# Patient Record
Sex: Male | Born: 1945 | Race: Black or African American | Hispanic: No | Marital: Single | Smoking: Never smoker
Health system: Southern US, Academic
[De-identification: ages and names within clinical notes are randomized; demographics above are authoritative.]

## PROBLEM LIST (undated history)

## (undated) DIAGNOSIS — R040 Epistaxis: Secondary | ICD-10-CM

## (undated) DIAGNOSIS — N400 Enlarged prostate without lower urinary tract symptoms: Secondary | ICD-10-CM

## (undated) DIAGNOSIS — I10 Essential (primary) hypertension: Secondary | ICD-10-CM

## (undated) HISTORY — PX: HX HERNIA REPAIR: SHX51

---

## 2006-01-19 ENCOUNTER — Other Ambulatory Visit (HOSPITAL_COMMUNITY): Payer: Self-pay

## 2021-10-28 ENCOUNTER — Encounter (HOSPITAL_COMMUNITY): Admission: RE | Payer: Self-pay | Source: Ambulatory Visit

## 2021-10-28 ENCOUNTER — Inpatient Hospital Stay (HOSPITAL_COMMUNITY): Admission: RE | Admit: 2021-10-28 | Payer: Medicare Other | Source: Ambulatory Visit | Admitting: Surgery

## 2021-10-28 SURGERY — COLONOSCOPY
Anesthesia: General

## 2021-11-02 IMAGING — MR MRI BRAIN WITHOUT AND WITH CONTRAST
10 of 13 series · 35 of 48 positions shown · IV contrast (gadavist)
Comparison: None available.

﻿EXAM:  MRI BRAIN WITHOUT AND WITH CONTRAST
INDICATION: Headaches, left-sided weakness and facial numbness.
TECHNIQUE: Multiplanar multisequential MRI of the brain was performed without and with 3 mL of Gadavist.

[Series 10: DWI · axial · 5.0mm · 1.35mm/px · z∈[-48,+78]mm · 9 of 88 slices shown (1 of 3)]
[im 1/88]
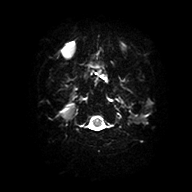
[im 16/88]
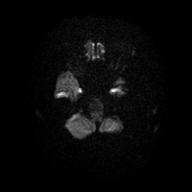
[im 24/88]
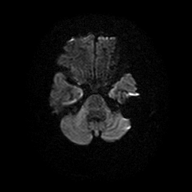
[im 40/88]
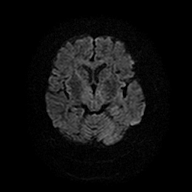
[im 48/88]
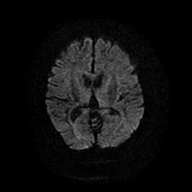
[im 64/88]
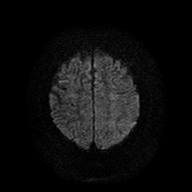
[im 72/88]
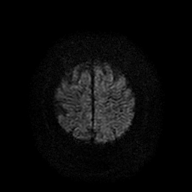
[im 80/88]
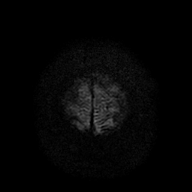
[im 88/88]
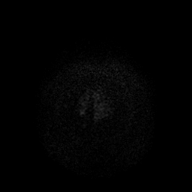

[Series 11: DWI · axial · 5.0mm · 1.35mm/px · z∈[-48,+78]mm · 3 of 22 slices shown (2 of 3)]
[im 1/22]
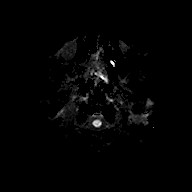
[im 11/22]
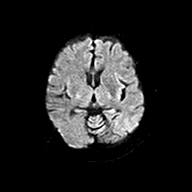
[im 22/22]
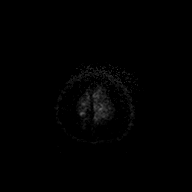

[Series 12: DWI · axial · 5.0mm · 1.35mm/px · z∈[-48,+78]mm · 3 of 22 slices shown (3 of 3)]
[im 1/22]
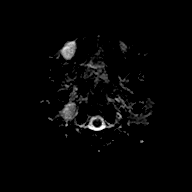
[im 11/22]
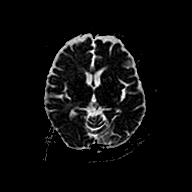
[im 22/22]
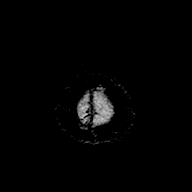

[Series 13: FLAIR · sagittal · 4.0mm · 0.75mm/px · 3 of 26 slices shown (1 of 2)]
[im 1/26]
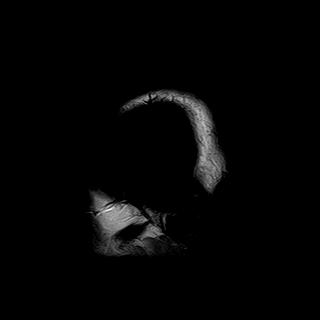
[im 13/26]
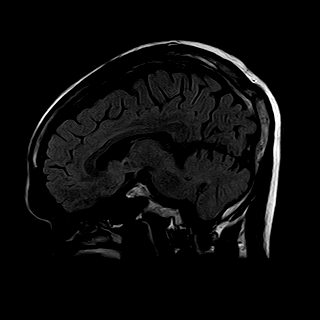
[im 26/26]
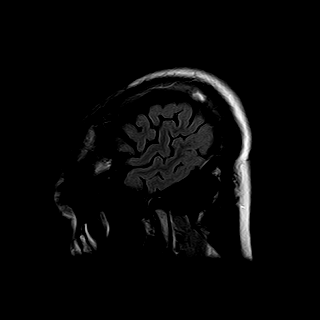

[Series 14: T2 · axial · 5.0mm · 0.43mm/px · z∈[-92,+46]mm · 3 of 25 slices shown (1 of 2)]
[im 1/25]
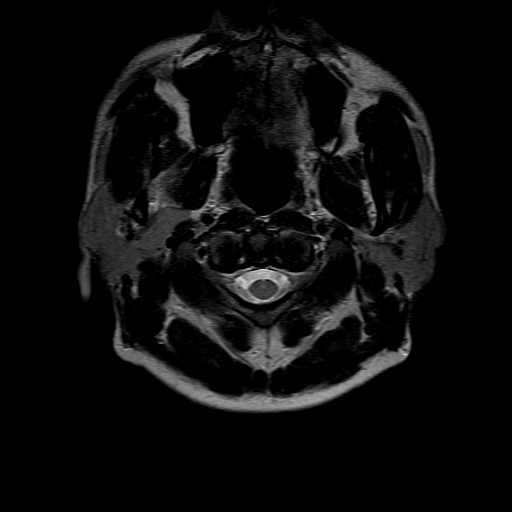
[im 13/25]
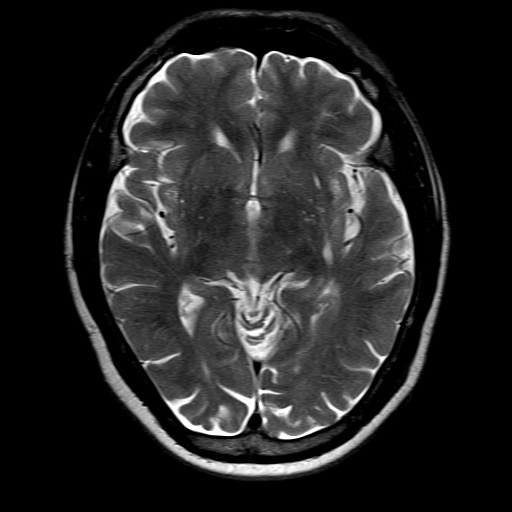
[im 25/25]
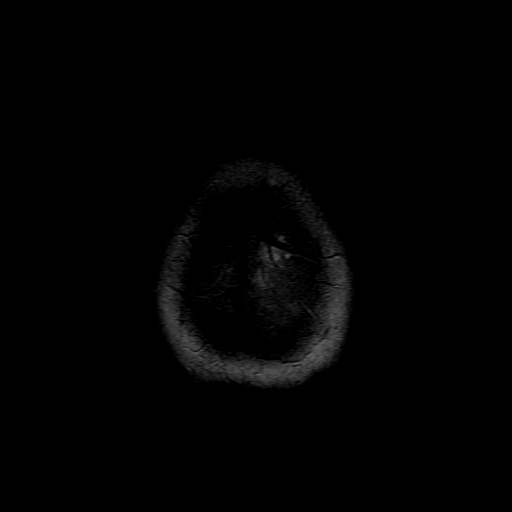

[Series 15: FLAIR · axial · 5.0mm · 0.43mm/px · z∈[-92,+46]mm · 3 of 25 slices shown (2 of 2)]
[im 1/25]
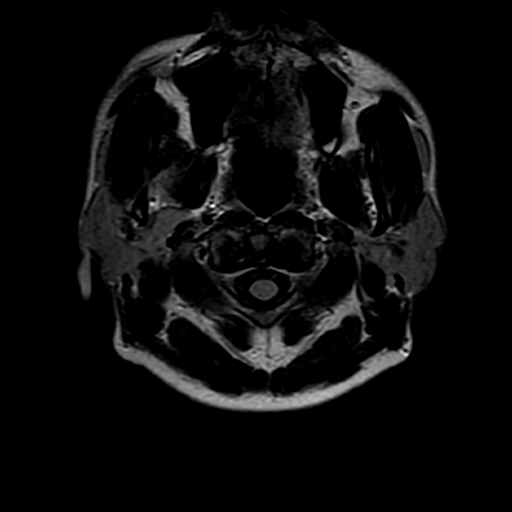
[im 13/25]
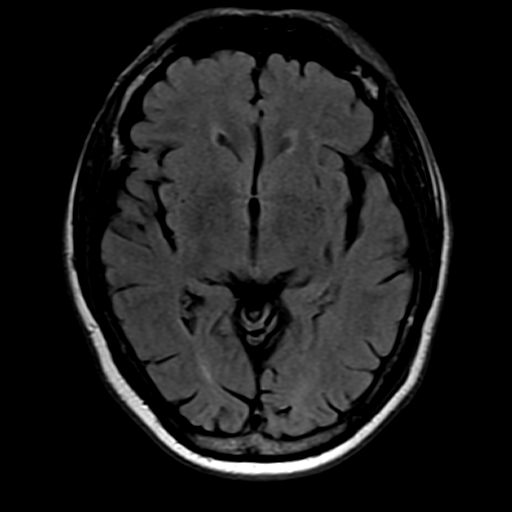
[im 25/25]
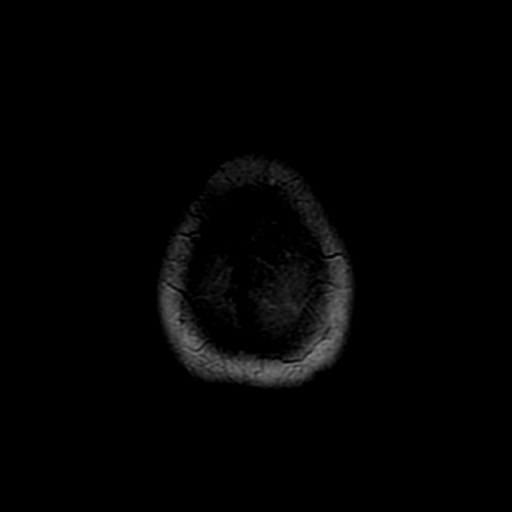

[Series 16: T1 · axial · 5.0mm · 0.43mm/px · z∈[-92,+46]mm · 3 of 25 slices shown]
[im 1/25]
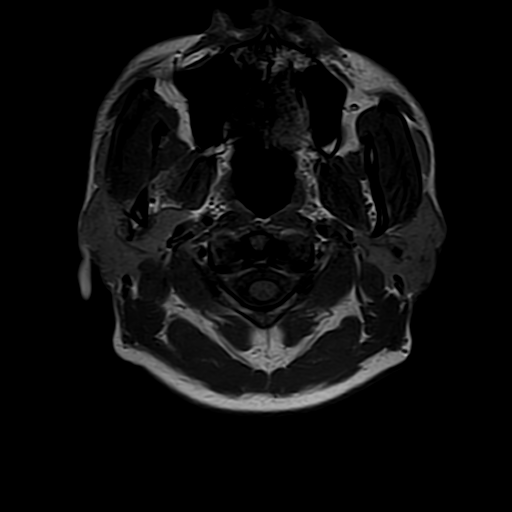
[im 13/25]
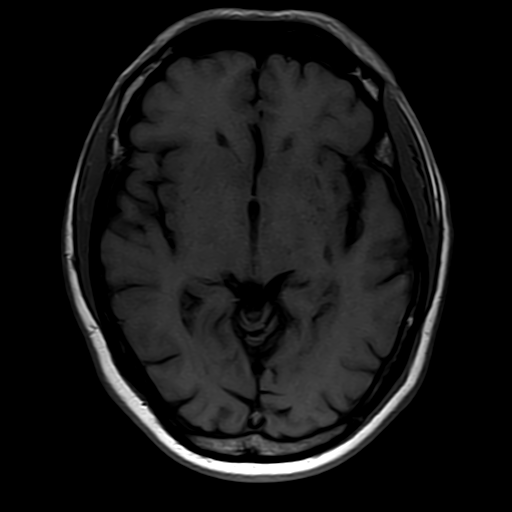
[im 25/25]
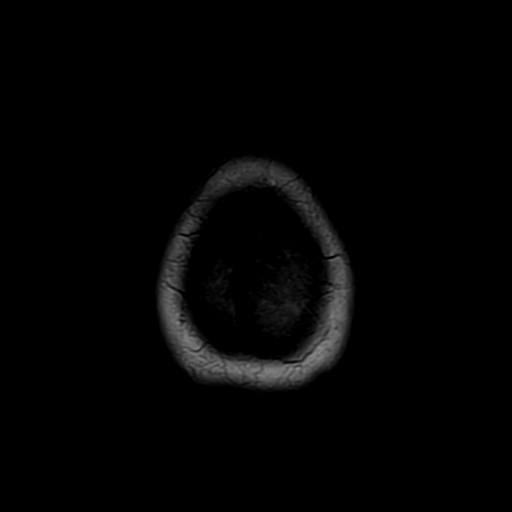

[Series 18: T2 · coronal · 6.0mm · 0.43mm/px · 3 of 24 slices shown (2 of 2)]
[im 1/24]
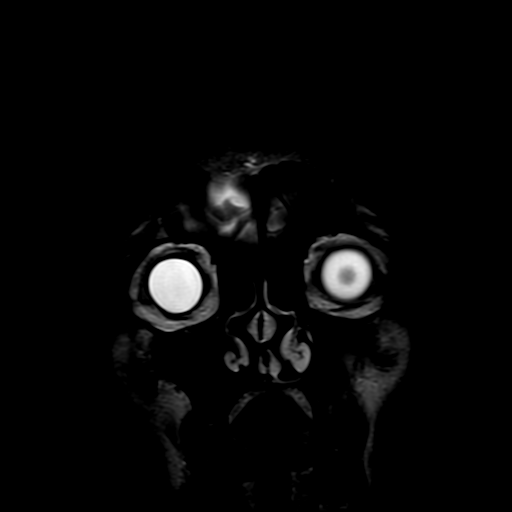
[im 12/24]
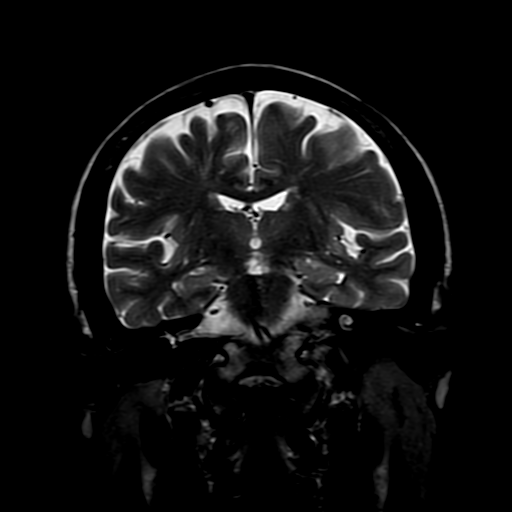
[im 24/24]
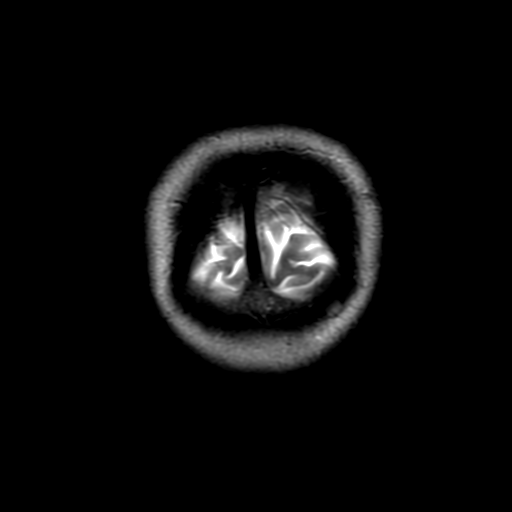

[Series 19: T1 fat-sat · coronal · 5.0mm · 0.57mm/px · 3 of 24 slices shown (1 of 2)]
[im 1/24]
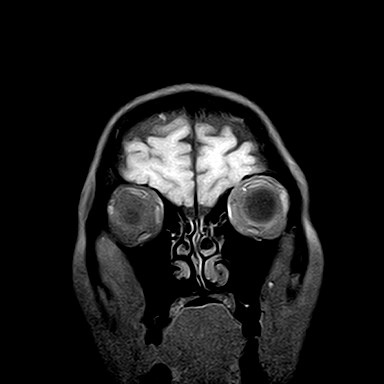
[im 12/24]
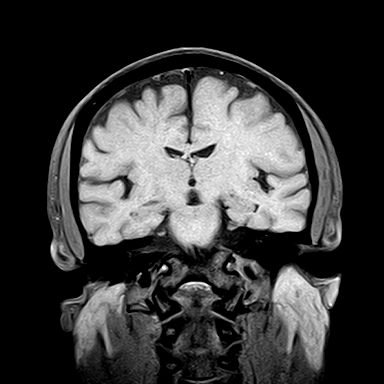
[im 24/24]
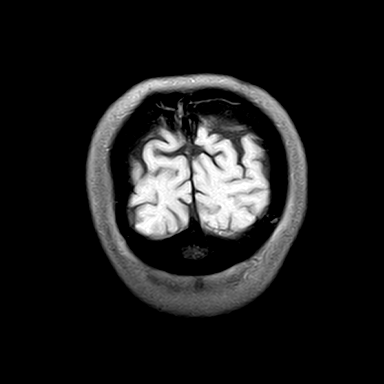

[Series 20: T1 fat-sat · axial · 5.0mm · 0.43mm/px · z∈[-92,-23]mm · 2 of 25 slices shown (2 of 2)]
[im 1/25]
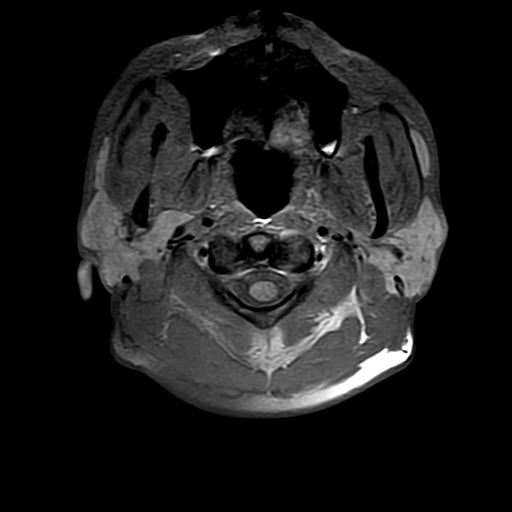
[im 13/25]
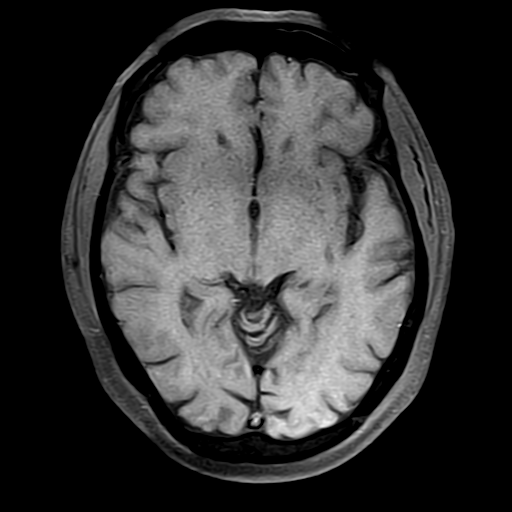

[35 of 48 positions shown; findings below may reference images not displayed]

FINDINGS: Ventricular and sulcal size is normal for the patient's age.  There are minimal chronic small vessel ischemic changes.  There is no mass effect, midline shift or intracranial hemorrhage. There is no evidence of acute infarction or prior microhemorrhages. Skull base flow voids and basal cisterns are patent.  Sagittal survey of midline structures is unremarkable. Following intravenous contrast administration, there is no abnormal parenchymal or leptomeningeal enhancement.  There are no extra-axial fluid collections. Visualized paranasal sinuses, mastoid air cells and orbital contents are unremarkable.
IMPRESSION: 1. Minimal chronic small vessel ischemic changes, no acute intracranial abnormality.  

 2. No abnormal parenchymal or leptomeningeal enhancement.

## 2021-11-02 IMAGING — MR MRI CERVICAL SPINE WITHOUT AND WITH CONTRAST
5 of 8 series · 30 of 48 positions shown · IV contrast (Gadavist)
Comparison: None available.

﻿EXAM:  67087   MRI CERVICAL SPINE WITHOUT AND WITH CONTRAST
INDICATION: Neck pain with left upper extremity radiculopathy.
TECHNIQUE: Multiplanar multisequential MRI of the cervical spine was performed without and with 3 mL of Gadavist.

[Series 5: T2 · sagittal · 3.0mm · 0.62mm/px · 6 of 15 slices shown (1 of 2)]
[im 1/15]
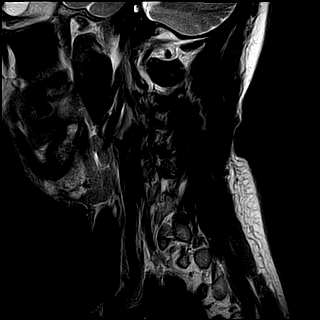
[im 3/15]
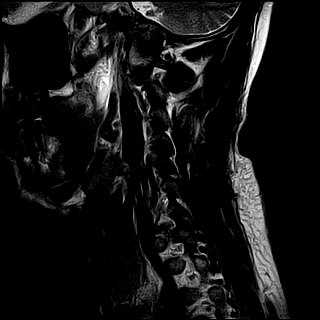
[im 6/15]
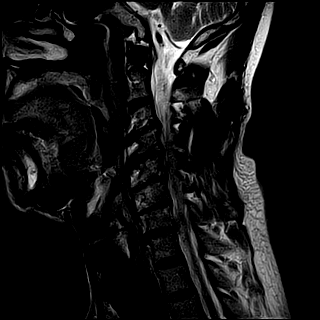
[im 9/15]
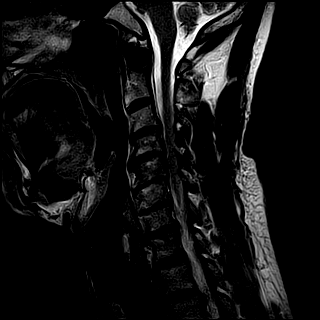
[im 12/15]
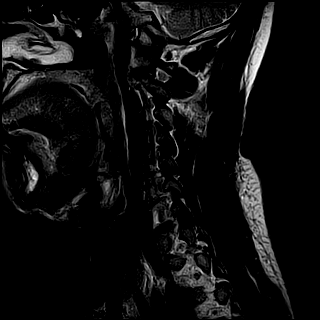
[im 15/15]
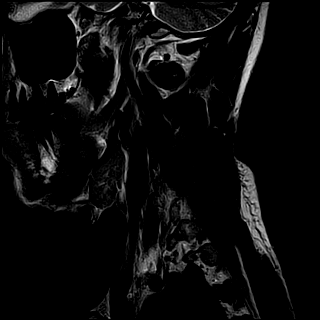

[Series 7: T1 · sagittal · 3.0mm · 0.39mm/px · 5 of 15 slices shown]
[im 1/15]
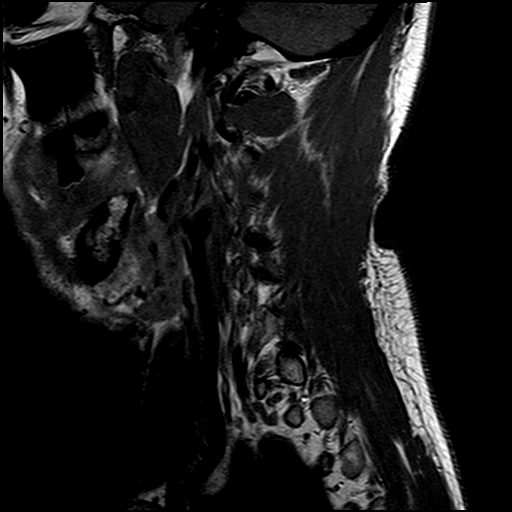
[im 4/15]
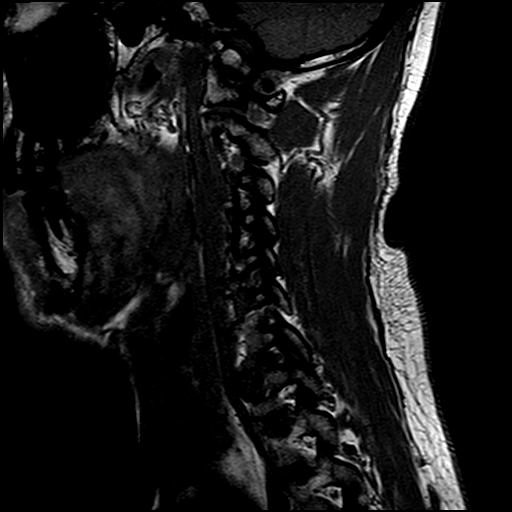
[im 8/15]
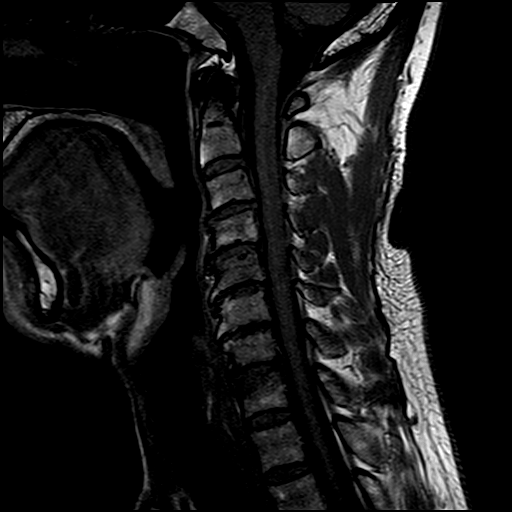
[im 11/15]
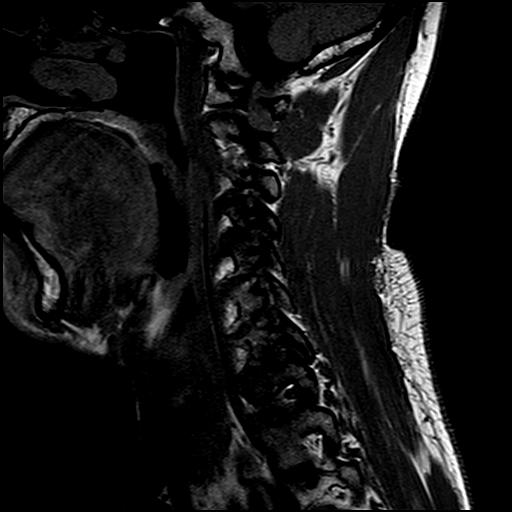
[im 15/15]
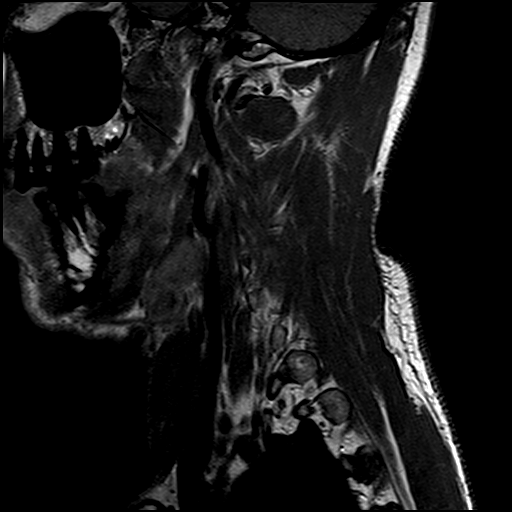

[Series 8: T2 · axial · 3.0mm · 0.39mm/px · z∈[-67,+30]mm · 6 of 18 slices shown (2 of 2)]
[im 1/18]
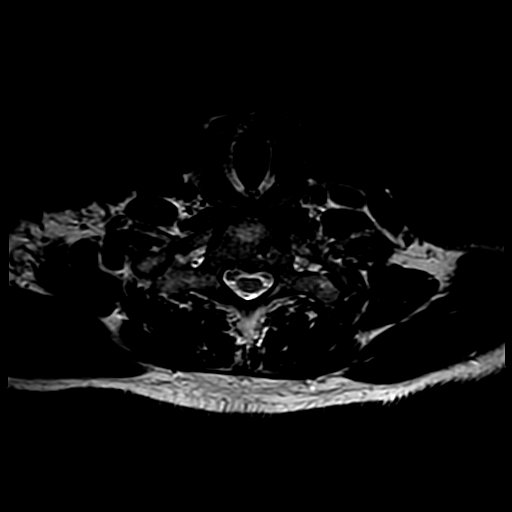
[im 4/18]
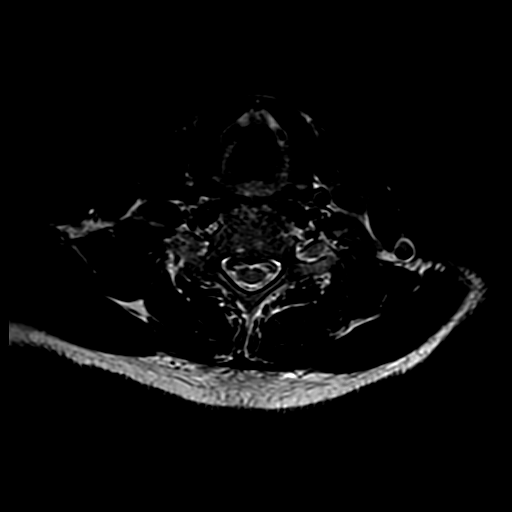
[im 7/18]
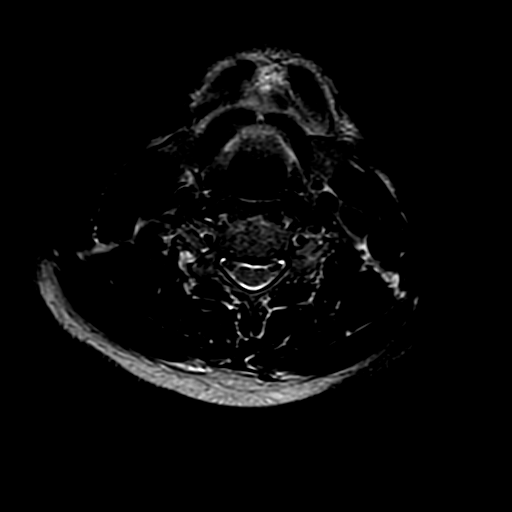
[im 11/18]
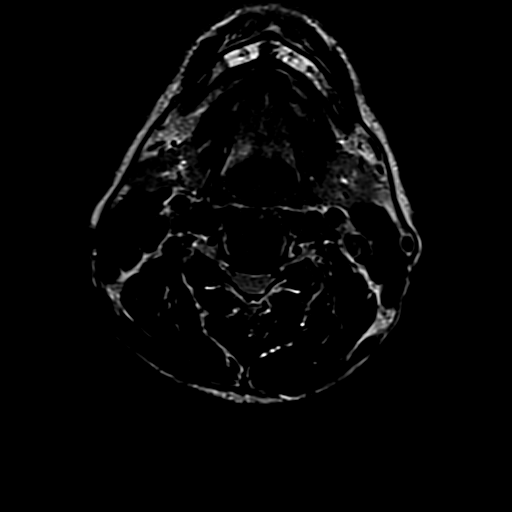
[im 14/18]
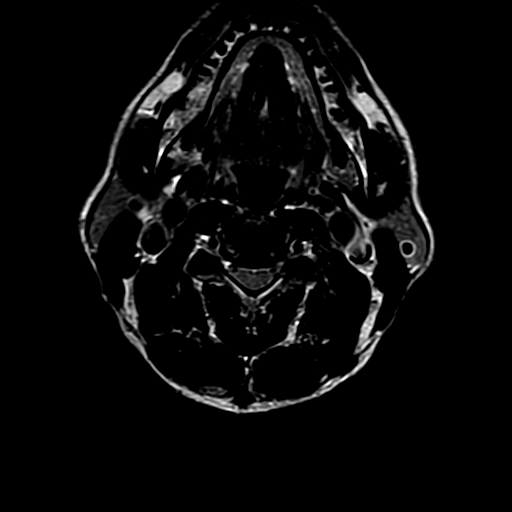
[im 18/18]
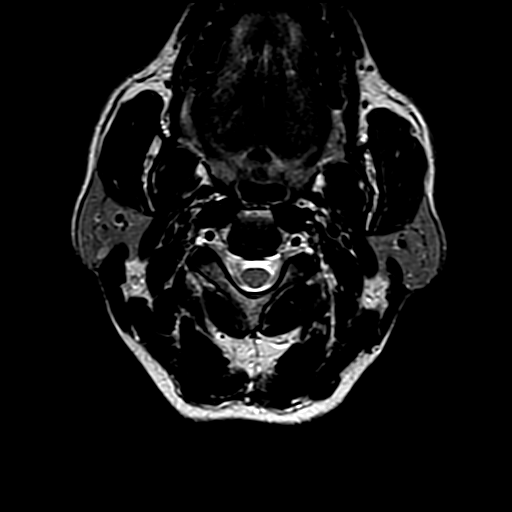

[Series 15: T1 fat-sat post-contrast · sagittal · 3.0mm · 0.62mm/px · 5 of 15 slices shown (1 of 2)]
[im 1/15]
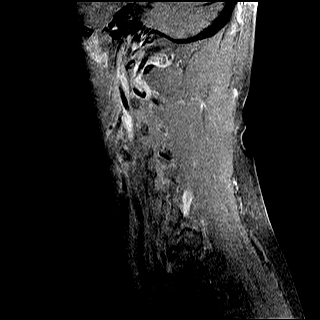
[im 4/15]
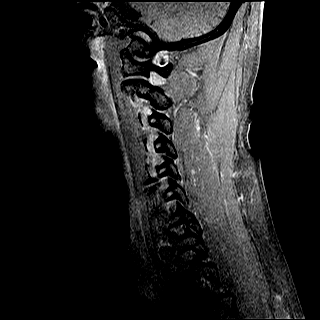
[im 8/15]
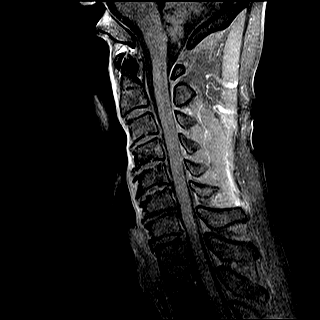
[im 11/15]
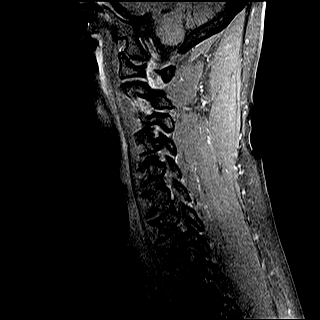
[im 15/15]
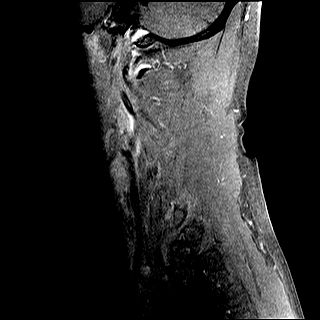

[Series 16: T1 fat-sat post-contrast · axial · 3.0mm · 0.70mm/px · z∈[-79,+19]mm · 8 of 26 slices shown (2 of 2)]
[im 1/26]
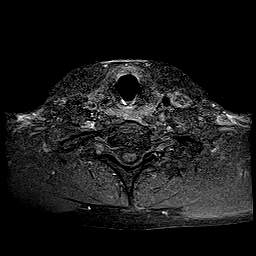
[im 4/26]
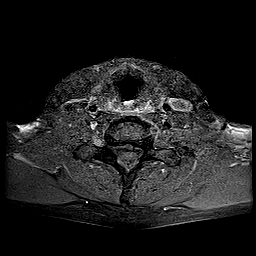
[im 7/26]
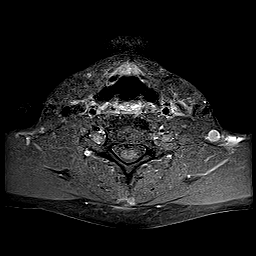
[im 10/26]
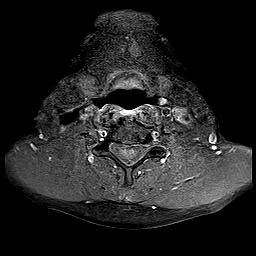
[im 16/26]
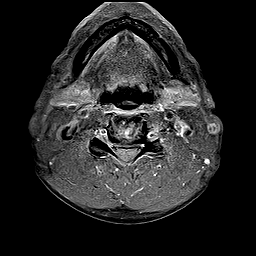
[im 19/26]
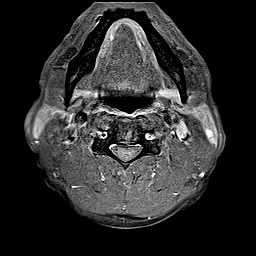
[im 22/26]
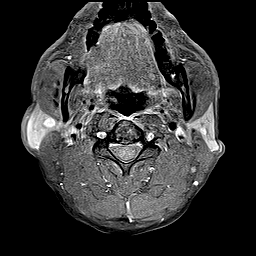
[im 26/26]
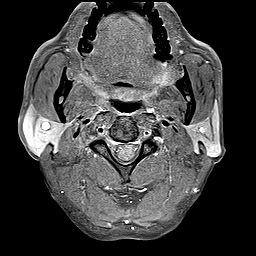

[30 of 48 positions shown; findings below may reference images not displayed]

FINDINGS: Straightening of cervical lordosis is likely positional. Bone marrow signal intensity is normal. There is no acute fracture or subluxation. Visualized spinal cord is also normal in signal intensity without evidence of compression at any level. 

C2-3 level is unremarkable. 

At C3-4 level, there is a small broad-based central disc osteophyte complex resulting in mild spinal stenosis. There is moderate to severe bilateral neural foraminal stenosis from facet and uncovertebral joint hypertrophy. 

At C4-5 level, there is a small broad-based central disc osteophyte complex resulting in mild-to-moderate spinal stenosis. There is moderate to severe left and mild right neural foraminal stenosis from facet and uncovertebral joint hypertrophy.

At C5-6 level, there is a small broad-based central disc osteophyte complex resulting in mild spinal stenosis. There is moderate to severe left and mild right neural foraminal stenosis from facet and uncovertebral joint hypertrophy. 

At C6-7 level, there is moderate left neural foraminal stenosis from uncovertebral joint hypertrophy. 

At C7-T1 level, there is a small broad-based central disc bulge with near complete effacement of the ventral CSF.  There is mild left neural foraminal stenosis from uncovertebral joint hypertrophy.

Paraspinal soft tissues are unremarkable.  Following intravenous contrast administration, there is no abnormal spinal cord or paraspinal soft tissue enhancement.
IMPRESSION: 1. Small disc osteophyte complexes at multiple levels with mild-to-moderate spinal stenosis at C4-5 level and mild spinal stenosis at C3-4 and C5-6 levels.  

2. Multilevel neural foraminal stenosis as detailed above.

## 2022-06-27 ENCOUNTER — Emergency Department (HOSPITAL_BASED_OUTPATIENT_CLINIC_OR_DEPARTMENT_OTHER): Payer: Medicare Other

## 2022-06-27 ENCOUNTER — Emergency Department
Admission: EM | Admit: 2022-06-27 | Discharge: 2022-06-27 | Disposition: A | Discharge status: Home / Self Care | Payer: Medicare Other | Attending: FAMILY PRACTICE | Admitting: FAMILY PRACTICE

## 2022-06-27 ENCOUNTER — Encounter (HOSPITAL_BASED_OUTPATIENT_CLINIC_OR_DEPARTMENT_OTHER): Payer: Self-pay

## 2022-06-27 ENCOUNTER — Other Ambulatory Visit: Payer: Self-pay

## 2022-06-27 DIAGNOSIS — I10 Essential (primary) hypertension: Secondary | ICD-10-CM

## 2022-06-27 DIAGNOSIS — N400 Enlarged prostate without lower urinary tract symptoms: Secondary | ICD-10-CM

## 2022-06-27 DIAGNOSIS — H8110 Benign paroxysmal vertigo, unspecified ear: Secondary | ICD-10-CM

## 2022-06-27 HISTORY — DX: Essential (primary) hypertension: I10

## 2022-06-27 HISTORY — DX: Benign prostatic hyperplasia without lower urinary tract symptoms: N40.0

## 2022-06-27 LAB — BASIC METABOLIC PANEL
ANION GAP: 8 mmol/L (ref 4–13)
BUN/CREA RATIO: 18
BUN: 24 mg/dL — ABNORMAL HIGH (ref 7–18)
CALCIUM: 9.2 mg/dL (ref 8.5–10.1)
CHLORIDE: 103 mmol/L (ref 98–107)
CO2 TOTAL: 28 mmol/L (ref 21–32)
CREATININE: 1.34 mg/dL — ABNORMAL HIGH (ref 0.70–1.30)
ESTIMATED GFR: 54 mL/min/{1.73_m2} — ABNORMAL LOW (ref >59)
GLUCOSE: 87 mg/dL (ref 74–106)
OSMOLALITY, CALCULATED: 281 mOsm/kg (ref 270–290)
POTASSIUM: 4.3 mmol/L (ref 3.5–5.1)
SODIUM: 139 mmol/L (ref 136–145)

## 2022-06-27 LAB — CBC WITH DIFF
BASOPHIL #: 0.01 10*3/uL (ref 0.00–0.30)
BASOPHIL %: 0 % (ref 0–3)
EOSINOPHIL #: 0.1 10*3/uL (ref 0.00–0.80)
EOSINOPHIL %: 2 % (ref 0–7)
HCT: 41.9 % — ABNORMAL LOW (ref 42.0–51.0)
HGB: 14.1 g/dL (ref 13.5–18.0)
LYMPHOCYTE #: 2.03 10*3/uL (ref 1.10–5.00)
LYMPHOCYTE %: 48 % — ABNORMAL HIGH (ref 25–45)
MCH: 32.9 pg — ABNORMAL HIGH (ref 27.0–32.0)
MCHC: 33.6 g/dL (ref 32.0–36.0)
MCV: 97.8 fL (ref 78.0–99.0)
MONOCYTE #: 0.53 10*3/uL (ref 0.00–1.30)
MONOCYTE %: 13 % — ABNORMAL HIGH (ref 0–12)
MPV: 8.3 fL (ref 7.4–10.4)
NEUTROPHIL #: 1.52 10*3/uL — ABNORMAL LOW (ref 1.80–8.40)
NEUTROPHIL %: 36 % — ABNORMAL LOW (ref 40–76)
PLATELETS: 260 10*3/uL (ref 140–440)
RBC: 4.28 10*6/uL (ref 4.20–6.00)
RDW: 14.8 % (ref 11.6–14.8)
WBC: 4.2 10*3/uL (ref 4.0–10.5)

## 2022-06-27 NOTE — ED Triage Notes (Addendum)
This afternoon my ear starting hurting me - patient states as he is talking to me it feels like his left ear is wobbling, and that his sound is bouncing back.  No dizziness noted.  Patient is afraid the bite of lamb that he took this evening that didn't taste good could have poisoned him. Patient states he was bit by a tick 11 days ago on his left arm - went to clinic and did get antibotics.

## 2022-06-27 NOTE — ED Attending Handoff Note (Signed)
MDM:    ED Course as of 06/27/22 2008   Sun Jun 27, 2022   2007 Resting comfortably on my re-evaluation completely asymptomatic.  No signs or symptoms of stroke.  CT head showed no acute abnormalities lab work benign.  I did review these findings with the patient and answered his questions.  I doubt critical pathology at this time.  Patient instructed to follow with primary care.  I gave strict return to ED instructions in both a verbal written manner.  He is ambulatory and comfortable with discharge.      Discharged  Clinical Impression   Benign paroxysmal positional vertigo, unspecified laterality (Primary)   Essential hypertension   Benign prostatic hyperplasia without lower urinary tract symptoms           Current Discharge Medication List        CONTINUE these medications - NO CHANGES were made during your visit.        Details   tamsulosin 0.4 mg Capsule  Commonly known as: FLOMAX   0.4 mg, Oral, EVERY EVENING AFTER DINNER  Refills: 0     verapamiL 240 mg Tablet Sustained Release   240 mg, Oral, DAILY  Refills: 0

## 2022-06-27 NOTE — ED Provider Notes (Shared)
[facility], [facility] Emergency Department  ED Primary Provider Note  History of Present Illness   Chief Complaint   Patient presents with    Ear Pain     Harry Walker is a 76 y.o. male who had concerns including Ear Pain.  Arrival: The patient arrived by Car  {Use the HPI, Phys Exam, & MDM tabs at the top of the note composer to access the [employer] for the respective sections as needed. As of Aug 30, 2021 you are only required to have a "medically appropriate" History, ROS, and PE for billing purposes. Do not modify this italicized text, it will disappear upon signing your note:[id number]}  This 76 year old male patient presents to the emergency department with complaints of some dizziness that was short-lived, along with a wobbly sensation and drum like sensation in his left ear at home.  That was short-lived, he then came here to be evaluated because he was afraid that he ate some lab that he thought was bad about 3 hours ago, and that he may have food poisoning that cause the dizziness.  He also states that he was bitten by a tick, and removed in about 11 days ago and is worried about bad as well.  He is had no headaches, body aches or joint aches beyond his norm, tinnitus, hearing loss, nasal congestion, postnasal drip, cough, wheeze, dyspnea, asthma, bronchitis, pneumonia, COPD.  He has no chest pain, pressure, heaviness, palpitations, tachycardia.  Does have hypertension that is treated and controlled.  He is no complaints of allergies and no congestion or sneezing.  He is had no nausea, vomiting, diarrhea, constipation, appetite, energy change.  He is had no travel outside of the area.  He was retired.  He denies ever using alcohol, tobacco, vaping, marijuana, or illicit drug use.  He was from [place] but has not been home in about 8 years.    He is not COVID or influenza vaccinated and has never had COVID that he is aware of.      Review of Systems   Pertinent  positive and negative ROS as per HPI.  Historical Data   History Reviewed This Encounter:  Patient's past medical, surgical.    Physical Exam   ED Triage Vitals [06/27/22 1837]   BP (Non-Invasive) 137/79   Heart Rate 69   Respiratory Rate 18   Temperature 36.9 C (98.4 F)   SpO2 99 %   Weight 68 kg (150 lb)   Height 1.93 m (6\' 4" )     Physical Exam  General:  No acute distress, nontoxic.  He is alert oriented and cooperative.  Eyes, sclera is white, conjunctivae is pink, PERRLA, EOMI, no horizontal or vertical nystagmus noted and no dizziness  Ears:  TMs are clear without fluid or retraction, EACs are patent no significant cerumen noted, specifically no cerumen against the TM.  Nasal: Slightly boggy, scant amount of white mucus, patent bilaterally  Oral:  Oral cavity is pink and moist   Pharynx:  Pink and moist without PND exudate or petechiae   Neck:  Supple without adenopathy.    Lungs: Clear symmetric regular rate rhythm S1-S2 without murmur gallop  Abdomen: Soft, normal bowel sounds and nontender.  Extremities: Moving symmetrically   Supine rapid head rotation shows no nystagmus, no dizziness.  Rapid positional changes again showed no nystagmus and no dizziness.  Orthostatics were negative, and he was asymptomatic.    Patient Data   {Click here to  open the ED Workup Activity for clinical data review *This link will automatically disappear upon signing your note*:123}Labs Ordered/Reviewed - No data to display  No orders to display     Medical Decision Making      {Be sure to fill out the MDM SmartBlock in Notewriter to the left. Do not modify this italicized text, it will disappear upon signing your note:[id number]}  Medical Decision Making  Moderate complexity straightforward, likely benign positional vertigo, highly doubtful that there is any food-borne illness due to the time labs from the meal to the dizziness symptoms, and no GI symptoms.  Checked electrolytes and CBC to look for abnormalities, such as  hyponatremia, hypocalcemia or hypo kalemia and severe anemia.  This was very short lived and non recurring symptom.    Amount and/or Complexity of Data Reviewed  Labs: ordered.      ED Course as of 06/27/22 1925   Wynelle Link Jun 27, 2022   [id number] Patient transitioned over to night shift provider, Dr. Katrinka Blazing final disposition and diagnosis once laboratory data CT has returned.            Clinical Impression   Benign paroxysmal positional vertigo, unspecified laterality (Primary)   Essential hypertension   Benign prostatic hyperplasia without lower urinary tract symptoms       Disposition: Data Unavailable diagnoses and disposition pending laboratory data CT results, and will be performed by Dr. Katrinka Blazing, night shift provider.    Marland Kitchen    Sondra Come, DO       {Remember to refresh your note prior to signing. Use Control + F11 or click the refresh button at the bottom of the note. This reminder text will automatically disappear when you sign your note.:[id number]}

## 2022-06-27 NOTE — Discharge Instructions (Signed)
Runs of or continuance of the vertigo or dizziness you may need to see an audiologist such as Dr. Junious Silk in [place].  Continue home medications as prescribed, recommend you call follow-up with your primary care provider regarding the symptoms.  Return to the ER comes worsen or recur in a concerning manner.

## 2022-06-27 NOTE — ED Nurses Note (Signed)
Patient given discharge instructions. Encouraged follow up with Dr. Junious Silk or another audiologist to manage symptoms. Verbalized understanding and denied further needs.

## 2023-01-06 ENCOUNTER — Emergency Department (HOSPITAL_BASED_OUTPATIENT_CLINIC_OR_DEPARTMENT_OTHER): Payer: Medicare Other

## 2023-01-06 ENCOUNTER — Other Ambulatory Visit: Payer: Self-pay

## 2023-01-06 ENCOUNTER — Encounter (HOSPITAL_BASED_OUTPATIENT_CLINIC_OR_DEPARTMENT_OTHER): Payer: Self-pay

## 2023-01-06 ENCOUNTER — Emergency Department
Admission: EM | Admit: 2023-01-06 | Discharge: 2023-01-06 | Disposition: A | Discharge status: Home / Self Care | Payer: Medicare Other | Attending: Family | Admitting: Family

## 2023-01-06 DIAGNOSIS — I10 Essential (primary) hypertension: Secondary | ICD-10-CM | POA: Insufficient documentation

## 2023-01-06 DIAGNOSIS — R600 Localized edema: Secondary | ICD-10-CM | POA: Insufficient documentation

## 2023-01-06 DIAGNOSIS — R040 Epistaxis: Secondary | ICD-10-CM | POA: Insufficient documentation

## 2023-01-06 DIAGNOSIS — Z7982 Long term (current) use of aspirin: Secondary | ICD-10-CM | POA: Insufficient documentation

## 2023-01-06 DIAGNOSIS — J329 Chronic sinusitis, unspecified: Secondary | ICD-10-CM

## 2023-01-06 LAB — COMPREHENSIVE METABOLIC PANEL, NON-FASTING
ALBUMIN/GLOBULIN RATIO: 1 (ref 0.8–1.4)
ALBUMIN: 3.8 g/dL (ref 3.4–5.0)
ALKALINE PHOSPHATASE: 103 U/L (ref 46–116)
ALT (SGPT): 28 U/L (ref <=78)
ANION GAP: 6 mmol/L (ref 4–13)
AST (SGOT): 23 U/L (ref 15–37)
BILIRUBIN TOTAL: 0.4 mg/dL (ref 0.2–1.0)
BUN/CREA RATIO: 14
BUN: 21 mg/dL — ABNORMAL HIGH (ref 7–18)
CALCIUM, CORRECTED: 9.5 mg/dL
CALCIUM: 9.3 mg/dL (ref 8.5–10.1)
CHLORIDE: 104 mmol/L (ref 98–107)
CO2 TOTAL: 29 mmol/L (ref 21–32)
CREATININE: 1.46 mg/dL — ABNORMAL HIGH (ref 0.70–1.30)
ESTIMATED GFR: 49 mL/min/{1.73_m2} — ABNORMAL LOW (ref >59)
GLOBULIN: 3.8
GLUCOSE: 86 mg/dL (ref 74–106)
OSMOLALITY, CALCULATED: 280 mOsm/kg (ref 270–290)
POTASSIUM: 3.9 mmol/L (ref 3.5–5.1)
PROTEIN TOTAL: 7.6 g/dL (ref 6.4–8.2)
SODIUM: 139 mmol/L (ref 136–145)

## 2023-01-06 LAB — CBC WITH DIFF
BASOPHIL #: 0.01 10*3/uL (ref 0.00–0.30)
BASOPHIL %: 0 % (ref 0–3)
EOSINOPHIL #: 0.1 10*3/uL (ref 0.00–0.80)
EOSINOPHIL %: 1 % (ref 0–7)
HCT: 43 % (ref 42.0–51.0)
HGB: 14.1 g/dL (ref 13.5–18.0)
LYMPHOCYTE #: 3.33 10*3/uL (ref 1.10–5.00)
LYMPHOCYTE %: 42 % (ref 25–45)
MCH: 32.2 pg — ABNORMAL HIGH (ref 27.0–32.0)
MCHC: 32.7 g/dL (ref 32.0–36.0)
MCV: 98.4 fL (ref 78.0–99.0)
MONOCYTE #: 0.65 10*3/uL (ref 0.00–1.30)
MONOCYTE %: 8 % (ref 0–12)
MPV: 8.1 fL (ref 7.4–10.4)
NEUTROPHIL #: 3.75 10*3/uL (ref 1.80–8.40)
NEUTROPHIL %: 48 % (ref 40–76)
PLATELETS: 367 10*3/uL (ref 140–440)
RBC: 4.37 10*6/uL (ref 4.20–6.00)
RDW: 15.2 % — ABNORMAL HIGH (ref 11.6–14.8)
WBC: 7.9 10*3/uL (ref 4.0–10.5)

## 2023-01-06 LAB — PTT (PARTIAL THROMBOPLASTIN TIME): APTT: 30.8 seconds (ref 22.4–31.7)

## 2023-01-06 MED ORDER — SODIUM CHLORIDE 0.9 % INTRAVENOUS PIGGYBACK
1.0000 g | INTRAVENOUS | Status: AC
Start: 2023-01-06 — End: 2023-01-06
  Administered 2023-01-06: 1 g via INTRAVENOUS
  Administered 2023-01-06: 0 g via INTRAVENOUS

## 2023-01-06 MED ORDER — OXYMETAZOLINE 0.05 % NASAL SPRAY
NASAL | Status: AC
Start: 2023-01-06 — End: 2023-01-06
  Filled 2023-01-06: qty 30

## 2023-01-06 MED ORDER — SODIUM CHLORIDE 0.9 % (FLUSH) INJECTION SYRINGE
3.0000 mL | INJECTION | INTRAMUSCULAR | Status: DC | PRN
Start: 2023-01-06 — End: 2023-01-07

## 2023-01-06 MED ORDER — CEPHALEXIN 500 MG CAPSULE
500.0000 mg | ORAL_CAPSULE | Freq: Three times a day (TID) | ORAL | 0 refills | Status: DC
Start: 2023-01-06 — End: 2023-01-20

## 2023-01-06 MED ORDER — OXYMETAZOLINE 0.05 % NASAL SPRAY
2.0000 | NASAL | Status: AC
Start: 2023-01-06 — End: 2023-01-06
  Administered 2023-01-06: 2 via NASAL

## 2023-01-06 MED ORDER — HYDRALAZINE 20 MG/ML INJECTION SOLUTION
INTRAMUSCULAR | Status: AC
Start: 2023-01-06 — End: 2023-01-06
  Filled 2023-01-06: qty 1

## 2023-01-06 MED ORDER — HYDRALAZINE 25 MG TABLET
25.0000 mg | ORAL_TABLET | Freq: Two times a day (BID) | ORAL | 0 refills | Status: AC
Start: 2023-01-06 — End: 2023-02-05

## 2023-01-06 MED ORDER — LIDOCAINE HCL 2 % MUCOSAL SOLUTION
Status: AC
Start: 2023-01-06 — End: 2023-01-06
  Filled 2023-01-06: qty 15

## 2023-01-06 MED ORDER — CEFTRIAXONE 1 GRAM SOLUTION FOR INJECTION
INTRAMUSCULAR | Status: AC
Start: 2023-01-06 — End: 2023-01-06
  Filled 2023-01-06: qty 10

## 2023-01-06 MED ORDER — TRAMADOL 37.5 MG-ACETAMINOPHEN 325 MG TABLET
1.0000 | ORAL_TABLET | Freq: Four times a day (QID) | ORAL | 0 refills | Status: DC | PRN
Start: 2023-01-06 — End: 2023-02-07

## 2023-01-06 MED ORDER — SODIUM CHLORIDE 0.9 % (FLUSH) INJECTION SYRINGE
3.0000 mL | INJECTION | Freq: Three times a day (TID) | INTRAMUSCULAR | Status: DC
Start: 2023-01-06 — End: 2023-01-07
  Administered 2023-01-06: 0 mL

## 2023-01-06 MED ORDER — LIDOCAINE HCL 2 % MUCOSAL SOLUTION
15.0000 mL | Status: AC
Start: 2023-01-06 — End: 2023-01-06
  Administered 2023-01-06: 15 mL

## 2023-01-06 MED ORDER — HYDRALAZINE 20 MG/ML INJECTION SOLUTION
20.0000 mg | INTRAMUSCULAR | Status: AC
Start: 2023-01-06 — End: 2023-01-06
  Administered 2023-01-06: 20 mg via INTRAVENOUS

## 2023-01-06 NOTE — Discharge Instructions (Addendum)
The rhinorocket has an inflated balloon in it, don't try to remove at home . Important to have close follow up. Packing will be left for 2 days.

## 2023-01-06 NOTE — ED Provider Notes (Signed)
[facility], [facility] Emergency Department  ED Primary Provider Note  History of Present Illness   Chief Complaint   Patient presents with    Nose Injury     Arrival: The patient arrived by Car  GEARALD Walker is a 77 y.o. male who had concerns including Nose Injury. Pt states left nose started bleeding and lot and now the rt. Was in [place] and had some allergy sx then no illness hx of HTN. Usually runs 150 to 160s 0vr 80's with verapamil. Denies ha cp sob nor other co. Takes aspirin daily   Review of Systems   Constitutional: No fever, chills or weakness   Skin: No rash or diaphoresis  HENT: No headaches, or congestion+ nose bleed.   Eyes: No vision changes or photophobia   Cardio: No chest pain, palpitations or leg swelling   Respiratory: No cough, wheezing or SOB  GI:  No nausea, vomiting or stool changes  GU:  No dysuria, hematuria, or increased frequency  MSK: No muscle aches, joint or back pain  Neuro: No seizures, LOC, numbness, tingling, or focal weakness  Psychiatric: No depression, SI or substance abuse  All other systems reviewed and are negative.    History Reviewed This Encounter: all noted and reviewed    Physical Exam   ED Triage Vitals [01/06/23 1843]   BP (Non-Invasive) (!) 182/102   Heart Rate 81   Respiratory Rate 17   Temperature 36.6 C (97.8 F)   SpO2 97 %   Weight 68 kg (150 lb)   Height 1.93 m (6\' 4" )       Constitutional:  77 y.o. male who appears in no distress. Normal color, no cyanosis.   HENT:   Head: Normocephalic and atraumatic.   Mouth/Throat: Oropharynx is clear and moist.  Profuse bleeding bil nares.  Eyes: EOMI, PERRL   Neck: Trachea midline. Neck supple.  Cardiovascular: RRR, No murmurs, rubs or gallops. Intact distal pulses.  Pulmonary/Chest: BS equal bilaterally. No respiratory distress. No wheezes, rales or chest tenderness.   Abdominal: Bowel sounds present and normal. Abdomen soft, no tenderness, no rebound and no guarding.  Back: No  midline spinal tenderness, no paraspinal tenderness, no CVA tenderness.           Musculoskeletal: No edema, tenderness or deformity.  Skin: warm and dry. No rash, erythema, pallor or cyanosis  Psychiatric: normal mood and affect. Behavior is normal.   Neurological: Patient keenly alert and responsive, easily able to raise eyebrows, facial muscles/expressions symmetric, speaking in fluent sentences, moving all extremities equally and fully, normal gait  Patient Data     Labs Ordered/Reviewed   COMPREHENSIVE METABOLIC PANEL, NON-FASTING - Abnormal; Notable for the following components:       Result Value    BUN 21 (*)     CREATININE 1.46 (*)     ESTIMATED GFR 49 (*)     All other components within normal limits    Narrative:     Estimated Glomerular Filtration Rate (eGFR) is calculated using the CKD-EPI (2021) equation, intended for patients 37 years of age and older. If gender is not documented or "unknown", there will be no eGFR calculation.   CBC WITH DIFF - Abnormal; Notable for the following components:    MCH 32.2 (*)     RDW 15.2 (*)     All other components within normal limits   PTT (PARTIAL THROMBOPLASTIN TIME) - Normal   CBC/DIFF    Narrative:  The following orders were created for panel order CBC/DIFF.  Procedure                               Abnormality         Status                     ---------                               -----------         ------                     CBC WITH ZOXW[[id number]]                Abnormal            Final result                 Please view results for these tests on the individual orders.     CT BRAIN WO IV CONTRAST   Final Result by Edi, [facility] In (05/09 2146)   No acute intracranial abnormality.      Small amount of blood within the left maxillary sinus.      Moderate mucosal edema within the left NaSal passage.         One or more dose reduction techniques were used (e.g., Automated exposure control, adjustment of the mA and/or kV according to patient size, use  of iterative reconstruction technique).         Radiologist location ID: [id number]           Medical Decision Making   Dif dx epistasis. HTN uncontrolled sinusitis.     Procedure : packed bilateral nares with Rhinorocket 4.5 size, 5 ml air placed. Bleeding improved significantly.   Medications Administered in the ED   NS flush syringe (0 mL Intracatheter Not Given 01/06/23 2200)   NS flush syringe (has no administration in time range)   lidocaine (XYLOCAINE) 2% oral topical viscous solution (15 mL Mucous Membrane Given 01/06/23 1900)   oxymetazoline (AFRIN) 0.05% nasal spray (2 Sprays INTRANASAL Given 01/06/23 1900)   hydrALAZINE (APRESOLINE) injection 20 mg (20 mg Intravenous Given 01/06/23 1926)   cefTRIAXone (ROCEPHIN) 1 g in NS 50 mL IVPB minibag (0 g Intravenous Stopped 01/06/23 2228)   1959 no bleeding states can't breathe through nose and head feel hot in the back. Nose feels like it will explode. BP improved. Ct ordered speech clear moving all extremities.  No neuro findings on ct. Sinus findings noted and treated no further bleeding no further rise in bp  Clinical Impression   Epistaxis - bil anterior (Primary)   Hypertension, uncontrolled   Sinusitis, unspecified chronicity, unspecified location       Disposition: Discharged  Critical Care Attestation:  As the ED nurse practitioner, I have provided critical care for this patient.  This patient has high probability of imminent life or limb threatening deterioration due to acute blood loss anemia. Severe nose bleed bil. Elevated BP. Uncontrolled bleeding with spray or pressure.   I provided direct patient care, documentation of findings, review of medical records, and pt had severe bleeding both nares.   and frequent reassessment.  Time spent providing critical care (exclusive of any billed procedures and/or teaching): 15 minutes bedside only

## 2023-01-06 NOTE — ED Triage Notes (Signed)
Patient reports nose bleed starting around 3:30pm but was able to control it. Patient states he began having another nose bleed and has not been able to control it.

## 2023-01-06 NOTE — ED Nurses Note (Signed)
IV DC. Cath intact.  No redness or edema at site.  Verbalized understanding of discharge instructions, rx education, and follow up information. AVS given to patient. VSS.  Left ER with no further complaints.

## 2023-01-08 ENCOUNTER — Other Ambulatory Visit: Payer: Self-pay

## 2023-01-08 ENCOUNTER — Emergency Department
Admission: EM | Admit: 2023-01-08 | Discharge: 2023-01-08 | Disposition: A | Discharge status: Home / Self Care | Payer: Medicare Other | Attending: Nurse Practitioner | Admitting: Nurse Practitioner

## 2023-01-08 ENCOUNTER — Encounter (HOSPITAL_BASED_OUTPATIENT_CLINIC_OR_DEPARTMENT_OTHER): Payer: Self-pay

## 2023-01-08 DIAGNOSIS — Z4800 Encounter for change or removal of nonsurgical wound dressing: Secondary | ICD-10-CM

## 2023-01-08 DIAGNOSIS — Z448 Encounter for fitting and adjustment of other external prosthetic devices: Secondary | ICD-10-CM | POA: Insufficient documentation

## 2023-01-08 NOTE — ED Triage Notes (Signed)
Patient states he is here for rhino rocket removal. States they have been in around 2 days.

## 2023-01-08 NOTE — ED Nurses Note (Signed)
Patient for bilateral rhino rocket removal. Denies any c/o pain, SOB. Bleeding controlled upon arrival. No acute distress noted.

## 2023-01-08 NOTE — ED Nurses Note (Signed)
Patient discharged with family. Patient educated on follow up with ENT for removal of bilateral rhino rockets. Patient verbalized understanding.

## 2023-01-08 NOTE — ED Provider Notes (Signed)
[facility], [facility] Emergency Department  ED Primary Provider Note  History of Present Illness   Chief Complaint   Patient presents with    Foreign Body in Nose     Harry Walker is a 77 y.o. male who had concerns including Foreign Body in Nose.  Arrival: The patient arrived by Car      This 77 year old male presented to the emergency department requesting to have rhino rockets removed from his bilateral nostrils.  He thought he was supposed to come back to the emergency department to get them removed.  He reports that the packing has been in for 2 days after having a nosebleed.  He was instructed that he will need to follow-up with ear nose and throat specialist have the packing removed.  He agrees to this plan.  He denies fever.  He denies shortness of breath.      History provided by:  Patient    History Reviewed This Encounter: Medical History  Surgical History  Family History  Social History    Physical Exam   ED Triage Vitals [01/08/23 1838]   BP (Non-Invasive) 119/74   Heart Rate 86   Respiratory Rate 17   Temperature 37.2 C (98.9 F)   SpO2 98 %   Weight 68 kg (150 lb)   Height 1.93 m (6\' 4" )     Physical Exam  Vitals and nursing note reviewed.   Constitutional:       General: He is not in acute distress.     Appearance: He is normal weight. He is not ill-appearing, toxic-appearing or diaphoretic.   HENT:      Head: Normocephalic and atraumatic.      Right Ear: External ear normal.      Left Ear: External ear normal.      Nose:      Comments: Rhino rockets in place to bilateral nostrils.     Mouth/Throat:      Mouth: Mucous membranes are moist.      Pharynx: Oropharynx is clear.   Eyes:      Conjunctiva/sclera: Conjunctivae normal.      Pupils: Pupils are equal, round, and reactive to light.   Cardiovascular:      Rate and Rhythm: Normal rate and regular rhythm.      Pulses: Normal pulses.      Heart sounds: Normal heart sounds.   Pulmonary:      Effort: Pulmonary  effort is normal. No respiratory distress.      Breath sounds: Normal breath sounds. No stridor. No wheezing, rhonchi or rales.   Chest:      Chest wall: No tenderness.   Abdominal:      General: Abdomen is flat. Bowel sounds are normal. There is no distension.      Palpations: Abdomen is soft.      Tenderness: There is no abdominal tenderness.   Musculoskeletal:         General: No swelling or tenderness. Normal range of motion.      Cervical back: Normal range of motion and neck supple.   Skin:     General: Skin is warm and dry.      Capillary Refill: Capillary refill takes less than 2 seconds.      Coloration: Skin is not jaundiced or pale.   Neurological:      General: No focal deficit present.      Mental Status: He is alert and oriented to person, place, and  time.      Cranial Nerves: No cranial nerve deficit.      Sensory: No sensory deficit.   Psychiatric:         Mood and Affect: Mood normal.         Behavior: Behavior normal.       Patient Data     Labs Ordered/Reviewed - No data to display  No orders to display     Medical Decision Making          Medical Decision Making  This 77 year old male presented to the emergency department requesting to have his rhino rockets removed from the bilateral nostrils.  He reports that they have been in for 2 days after having a nosebleed.  He states that he thought he was supposed to come back here to have them removed.  His discharge paperwork had him to follow-up with Dr.Weitzel to have the rhino rocket removed.  Physical exam revealed rhino rockets in bilateral nostrils.  There was no bleeding noted.  His breath sounds were clear and equal.  He denies fever or shortness of breath.  He will be discharged and instructed to follow-up with ear nose and throat specialist to have the rhino rocket removed.    Problems Addressed:  Encounter for removal of nasal packing: acute illness or injury                   Clinical Impression   Encounter for removal of nasal packing  (Primary)       Disposition: Discharged

## 2023-01-08 NOTE — Discharge Instructions (Signed)
Thank you for allowing [place] to be part of your care.  Please drink plenty of water.  The rhino rockets we will need to be removed by the ear nose and throat specialist.  Please call Dr. Geannie Risen office on Monday to schedule an appointment for the removal of the rhino rockets.  Return to the emergency department for any concerns.  We hope you feel better.

## 2023-01-10 ENCOUNTER — Ambulatory Visit (INDEPENDENT_AMBULATORY_CARE_PROVIDER_SITE_OTHER): Payer: Medicare Other | Admitting: OTOLARYNGOLOGY

## 2023-01-10 ENCOUNTER — Encounter (INDEPENDENT_AMBULATORY_CARE_PROVIDER_SITE_OTHER): Payer: Self-pay | Admitting: OTOLARYNGOLOGY

## 2023-01-10 ENCOUNTER — Other Ambulatory Visit: Payer: Self-pay

## 2023-01-10 VITALS — Ht 76.0 in | Wt 150.0 lb

## 2023-01-10 DIAGNOSIS — R040 Epistaxis: Secondary | ICD-10-CM

## 2023-01-10 DIAGNOSIS — J342 Deviated nasal septum: Secondary | ICD-10-CM

## 2023-01-10 MED ORDER — MUPIROCIN 2 % TOPICAL OINTMENT
TOPICAL_OINTMENT | Freq: Two times a day (BID) | CUTANEOUS | 1 refills | Status: DC
Start: 2023-01-10 — End: 2023-01-20

## 2023-01-10 NOTE — Procedures (Signed)
[facility]  [address]  [place] [address]    Procedure Note    Name: Harry Walker MRN:  [id number]   Date: 01/10/2023 DOB:  01-28-1946 (76 y.o.)         31238 - NASAL/SINUS ENDOSCOPY W/ CONTROL OF NASAL HEMORRHAGE (AMB ONLY)    Performed by: Conchita Paris, DO  Authorized by: Conchita Paris, DO    Time Out:     Immediately before the procedure, a time out was called:  Yes    Patient verified:  Yes    Procedure Verified:  Yes    Site Verified:  Yes  Documentation:        Endoscopic Control of Epistaxis    Topical Anesthesia:  Numbs Solution  Bleeding site:   left minimal nasal septum with ectatic vessels  Cauterized with 1 Silver Nitrate applicator(s)  Nasal packing: none      Pre op Diagnosis:  Epistaxis  Post op Diagnosis:  Epistaxis     Details of procedure:  After thorough discussion regarding details of this procedure, including the risks and benefits, the patient elected to proceed with the procedure.    Under local anesthesia without sedation, the patient was appropriately prepped and position.  Topical nasal solution 2 sprays into both nostrils for vasoconstrictive and anesthetic affect.      An endoscope was then inserted into both nasal vault, and the nasal vault were inspected.  No evidence of angiofibroma, or other posterior masses were noted.  The middle meatus free of disease bilaterally.     Using the endoscope, the blood vessels on the anterior portion of the nasal septum were identified.  Using silver nitrate the specific locations were cauterized.  Nasal packing inserted    Patient tolerated procedure well       Conchita Paris, DO

## 2023-01-10 NOTE — H&P (Signed)
[facility]  [address]  [place] [address]    New Patient Visit    Name: Harry Walker MRN:  [id number]   Date: 01/10/2023 DOB: 11/19/1945 (76 y.o.)          Referring Provider:    No referring provider defined for this encounter.    Reason for Visit:   Chief Complaint   Patient presents with    Epistaxis     Complains of epistaxis since last Thursday. Went to the ER and had bilateral rhino rockets placed        History of Present Illness:  Harry Walker is a 77 y.o. male who is referred by the ED for eval of epistaxis. Pt had bilat RhinoRockets placed in the ED on 01/06/23 to control bilat epistaxis. He takes ASA 81 mg daily. He is taking an oral abx. He tends to have elevated BP even on meds.       Patient History:  Patient Active Problem List   Diagnosis    Encounter for removal of nasal packing     Current Outpatient Medications   Medication Sig    cephalexin (KEFLEX) 500 mg Oral Capsule Take 1 Capsule (500 mg total) by mouth Three times a day for 10 days    hydrALAZINE (APRESOLINE) 25 mg Oral Tablet Take 1 Tablet (25 mg total) by mouth Twice daily for 30 days    mupirocin (BACTROBAN) 2 % Ointment Apply topically Twice daily    tamsulosin (FLOMAX) 0.4 mg Oral Capsule Take 1 Capsule (0.4 mg total) by mouth Every evening after dinner    traMADol-acetaminophen (ULTRACET) 37.5-325 mg Oral Tablet Take 1 Tablet by mouth Every 6 hours as needed    verapamiL 240 mg Oral Tablet Sustained Release Take 1 Tablet (240 mg total) by mouth Once a day     No Known Allergies  Past Medical History:   Diagnosis Date    Enlarged prostate     HTN (hypertension)       Past Surgical History:   Procedure Laterality Date    HX HERNIA REPAIR        Family Medical History:    None         Social History     Tobacco Use    Smoking status: Never    Smokeless tobacco: Never   Substance Use Topics    Alcohol use: Never    Drug use: Never       Review of Systems:  Review of Systems    Physical Exam:  Ht 1.93 m (6\' 4" )    Wt 68 kg (150 lb)   BMI 18.26 kg/m       ENT Physical Exam  Constitutional  Appearance: patient appears well-developed, well-nourished and well-groomed, patient is cooperative;  Communication/Voice: communication appropriate for developmental age; vocal quality normal;  Head and Face  Appearance: head appears normal, face appears normal and face appears atraumatic;  Salivary: glands normal;  Ear  Auricles: right auricle normal; left auricle normal;  Ear Canals: right ear canal normal; left ear canal normal;  Tympanic Membranes: right tympanic membrane normal; left tympanic membrane normal;  Nose  External Nose: nares patent bilaterally; external nose normal;  Internal Nose: bilateral intranasal mucosa edematous; nasal septal deviation present; bilateral inferior turbinates with hypertrophy;  Nose comments: Bilat RhinoRockets in place-- removed, no active bleeding    Oral Cavity/Oropharynx  Lips: normal;  Tongue: normal;  Oral mucosa: normal;  Hard palate: normal;  Soft  palate: normal;  Neck  Neck: neck normal; neck palpation normal;  Thyroid: thyroid normal;  Respiratory  Inspection: breathing unlabored; normal breathing rate;  Cardiovascular  Inspection: extremities are warm and well perfused;  Neurovestibular  Mental Status: alert and oriented;  Psychiatric: mood normal;  Cranial Nerves: cranial nerves intact;       Assessment:  ENCOUNTER DIAGNOSES     ICD-10-CM   1. Epistaxis  R04.0   2. 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  RhinoRockets removed. Usual precautions discussed. Rx mupirocin. Cautery performed on the left anterior septum.  Orders Placed This Encounter    [phone] - NASAL/SINUS ENDOSCOPY W/ CONTROL OF NASAL HEMORRHAGE (AMB ONLY)    mupirocin (BACTROBAN) 2 % Ointment     Return in about 4 weeks (around 02/07/2023).    Marcelline Deist, PA-C  The advanced practice clinician's documentation was reviewed/amended in its entirety with the assessment and  plan portion completely performed independently by me during this separate encounter.

## 2023-01-20 ENCOUNTER — Other Ambulatory Visit: Payer: Self-pay

## 2023-01-20 ENCOUNTER — Ambulatory Visit (INDEPENDENT_AMBULATORY_CARE_PROVIDER_SITE_OTHER): Payer: Medicare Other | Admitting: OTOLARYNGOLOGY

## 2023-01-20 ENCOUNTER — Encounter (INDEPENDENT_AMBULATORY_CARE_PROVIDER_SITE_OTHER): Payer: Self-pay | Admitting: OTOLARYNGOLOGY

## 2023-01-20 VITALS — Ht 76.0 in | Wt 150.0 lb

## 2023-01-20 DIAGNOSIS — R040 Epistaxis: Secondary | ICD-10-CM

## 2023-01-20 DIAGNOSIS — J342 Deviated nasal septum: Secondary | ICD-10-CM

## 2023-01-20 MED ORDER — AMOXICILLIN 875 MG-POTASSIUM CLAVULANATE 125 MG TABLET
1.0000 | ORAL_TABLET | Freq: Two times a day (BID) | ORAL | 0 refills | Status: DC
Start: 2023-01-20 — End: 2023-02-07

## 2023-01-20 MED ORDER — MUPIROCIN 2 % TOPICAL OINTMENT
TOPICAL_OINTMENT | Freq: Two times a day (BID) | CUTANEOUS | 1 refills | Status: AC
Start: 2023-01-20 — End: ?

## 2023-01-20 NOTE — H&P (Signed)
[facility]  [address]  [place] [address]    Return Patient Visit    Name: Harry Walker MRN:  [id number]   Date: 01/20/2023 DOB: 02/18/1946 (77 y.o.)       Referring Provider:  No ref. provider found    Reason for Visit:   Chief Complaint   Patient presents with    Epistaxis     4 week rc on epistaxis. States also having facial headache at times and a continuous pulsating pain in left side of nose.         History of Present Illness:  Harry Walker is a 77 y.o. male who is FU on epistaxis. No recurrence of epistaxis, only seeing dark blood here and there. Using mupirocin BID. He is feeling a pulsation in the nose intermittently. He completed the prophylactic abx.       Patient History:  Patient Active Problem List   Diagnosis    Encounter for removal of nasal packing     Current Outpatient Medications   Medication Sig    amoxicillin-pot clavulanate (AUGMENTIN) 875-125 mg Oral Tablet Take 1 Tablet by mouth Every 12 hours    aspirin (ECOTRIN) 81 mg Oral Tablet, Delayed Release (E.C.) Take 1 Tablet (81 mg total) by mouth Once a day    hydrALAZINE (APRESOLINE) 25 mg Oral Tablet Take 1 Tablet (25 mg total) by mouth Twice daily for 30 days    mupirocin (BACTROBAN) 2 % Ointment Apply topically Twice daily    tamsulosin (FLOMAX) 0.4 mg Oral Capsule Take 1 Capsule (0.4 mg total) by mouth Every evening after dinner    traMADol-acetaminophen (ULTRACET) 37.5-325 mg Oral Tablet Take 1 Tablet by mouth Every 6 hours as needed    verapamiL 240 mg Oral Tablet Sustained Release Take 1 Tablet (240 mg total) by mouth Once a day      No Known Allergies  Past Medical History:   Diagnosis Date    Enlarged prostate     HTN (hypertension)      Past Surgical History:   Procedure Laterality Date    HX HERNIA REPAIR       Family Medical History:    None         Social History     Tobacco Use    Smoking status: Never    Smokeless tobacco: Never   Substance Use Topics    Alcohol use: Never    Drug use: Never        Review of Systems:  Review of Systems    Physical Exam:  Ht 1.93 m (6\' 4" )   Wt 68 kg (150 lb)   BMI 18.26 kg/m       ENT Physical Exam  Constitutional  Appearance: patient appears well-developed, well-nourished and well-groomed, patient is cooperative;  Communication/Voice: communication appropriate for developmental age; vocal quality normal;  Head and Face  Appearance: head appears normal, face appears normal and face appears atraumatic;  Salivary: glands normal;  Ear  Auricles: right auricle normal; left auricle normal;  Ear Canals: right ear canal normal; left ear canal normal;  Tympanic Membranes: right tympanic membrane normal; left tympanic membrane normal;  Nose  External Nose: nares patent bilaterally; external nose normal;  Internal Nose: bilateral intranasal mucosa edematous; nasal septal deviation present; bilateral inferior turbinates with hypertrophy;  Nose comments: Bilat scab and crusting along septum, no active bleeding.   Oral Cavity/Oropharynx  Lips: normal;  Tongue: normal;  Oral mucosa: normal;  Hard palate:  normal;  Soft palate: normal;  Neck  Neck: neck normal; neck palpation normal;  Thyroid: thyroid normal;  Respiratory  Inspection: breathing unlabored; normal breathing rate;  Cardiovascular  Inspection: extremities are warm and well perfused;  Neurovestibular  Mental Status: alert and oriented;  Psychiatric: mood normal;  Cranial Nerves: cranial nerves intact;       Assessment:  ENCOUNTER DIAGNOSES     ICD-10-CM   1. Epistaxis  R04.0   2. 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  Refilled mupirocin.Marland Kitchen Healing well. No recurrence of epistaxis. Will start saline irrigations and give another course of Augmentin.  Orders Placed This Encounter    mupirocin (BACTROBAN) 2 % Ointment    amoxicillin-pot clavulanate (AUGMENTIN) 875-125 mg Oral Tablet     Return for Follow up as scheduled.    Marcelline Deist, PA-C  The advanced practice  clinician's documentation was reviewed/amended in its entirety with the assessment and plan portion completely performed independently by me during this separate encounter.

## 2023-02-07 ENCOUNTER — Encounter (HOSPITAL_COMMUNITY): Payer: Self-pay | Admitting: Surgery

## 2023-02-07 ENCOUNTER — Ambulatory Visit (HOSPITAL_COMMUNITY): Payer: Medicare Other | Admitting: Certified Registered"

## 2023-02-07 ENCOUNTER — Other Ambulatory Visit: Payer: Self-pay

## 2023-02-07 ENCOUNTER — Encounter (HOSPITAL_COMMUNITY)
Admission: RE | Disposition: A | Discharge status: Home / Self Care | Payer: Self-pay | Source: Ambulatory Visit | Attending: Surgery

## 2023-02-07 ENCOUNTER — Inpatient Hospital Stay
Admission: RE | Admit: 2023-02-07 | Discharge: 2023-02-07 | Disposition: A | Discharge status: Home / Self Care | Payer: Medicare Other | Source: Ambulatory Visit | Attending: Surgery | Admitting: Surgery

## 2023-02-07 ENCOUNTER — Encounter (HOSPITAL_COMMUNITY): Payer: Medicare Other | Admitting: Surgery

## 2023-02-07 DIAGNOSIS — I10 Essential (primary) hypertension: Secondary | ICD-10-CM | POA: Insufficient documentation

## 2023-02-07 DIAGNOSIS — R194 Change in bowel habit: Secondary | ICD-10-CM | POA: Insufficient documentation

## 2023-02-07 HISTORY — DX: Epistaxis: R04.0

## 2023-02-07 SURGERY — COLONOSCOPY
Anesthesia: General | Wound class: Clean Contaminated Wounds-The respiratory, GI, Genital, or urinary

## 2023-02-07 MED ORDER — SODIUM CHLORIDE 0.9 % INTRAVENOUS SOLUTION
INTRAVENOUS | Status: DC | PRN
Start: 2023-02-07 — End: 2023-02-07
  Administered 2023-02-07: 0 via INTRAVENOUS

## 2023-02-07 MED ORDER — LIDOCAINE (PF) 100 MG/5 ML (2 %) INTRAVENOUS SYRINGE
INJECTION | Freq: Once | INTRAVENOUS | Status: DC | PRN
Start: 2023-02-07 — End: 2023-02-07
  Administered 2023-02-07: 100 mg via INTRAVENOUS

## 2023-02-07 MED ORDER — PROPOFOL 10 MG/ML IV BOLUS
INJECTION | Freq: Once | INTRAVENOUS | Status: DC | PRN
Start: 2023-02-07 — End: 2023-02-07
  Administered 2023-02-07 (×3): 100 mg via INTRAVENOUS

## 2023-02-07 SURGICAL SUPPLY — 1 items: DETERGENT INSTR 22OZ TRNSPT GEL RINSE FREE NEUT PH PREKLENZ CLR PLSNT LF (MISCELLANEOUS PT CARE ITEMS) ×1 IMPLANT

## 2023-02-07 NOTE — OR Surgeon (Signed)
[facility]      Patient Name: [name], Harry I  Hospital Number: [id number]  Date of Service: 02/07/2023   Date of Birth: 08/24/1946      Pre-Operative Diagnosis: change  in bowel habits     Post-Operative Diagnosis: NORMAL EXAM    Procedure(s)/Description:  COLONOSCOPY: 45378 (CPT)     Attending Surgeon: Chloee Tena, MD     Anesthesia:  CRNA: Southers, Caressa, CRNA    Anesthesia Type: .General     Specimens Removed: NONE    Patient was taken to the endoscopy suite and given appropriate intravenous sedation.  Videocolonoscope was inserted into the rectum and advanced sequentially to the level of the cecum.  Cecum was confirmed by external palpation, presence of the ileocecal valve and transillumination of the light.  Picture was taken that documents this level.  Colonoscope was then subsequently withdrawn back inspecting all mucosal surfaces.  The ascending colon, transverse colon, descending colon, and sigmoid colon were all visualized with no specific abnormality.  The scope was withdrawn back to the level of the rectum and retroflexed.  The rectal anal junction visualized with no specific abnormality.  Scope was then subsequently straightened and withdrawn.  This concluded the procedure which patient tolerated well.  Based on current recommendations, patient would not need another routine diagnostic colonoscopy for 10 years barring change in presentation and/or symptoms.    FW Necole Minassian MD FACS

## 2023-02-07 NOTE — H&P (Signed)
[facility]  H&P Update Form    Harry [name], Harry [name], 77 y.o. male  Encounter Start Date:  02/07/2023  Inpatient Admission Date:   Date of Birth:  Jan 16, 1946    02/07/2023    STOP: IF H&P IS GREATER THAN 30 DAYS FROM SURGICAL DAY COMPLETE NEW H&P IS REQUIRED.     H & P updated the day of the procedure.  1.  H&P completed within 30 days of surgical procedure and has been reviewed within 24 hours of admission but prior to surgery or a procedure requiring anesthesia services by Dr.Fred Dewaine Conger MD. the patient has been examined, and no change has occured in the patients condition since the H&P was completed.       Change in medications: No                  Comments:     2.  Patient continues to be appropiate candidate for planned surgical procedure. YES      Ephriam Knuckles, MD

## 2023-02-07 NOTE — Anesthesia Preprocedure Evaluation (Signed)
ANESTHESIA PRE-OP EVALUATION  Planned Procedure: COLONOSCOPY  Review of Systems     anesthesia history negative     patient summary reviewed  nursing notes reviewed        Pulmonary  negative pulmonary ROS,    Cardiovascular    Hypertension and ECG reviewed ,No hyperlipidemia,  Exercise Tolerance: > or = 4 METS        GI/Hepatic/Renal   negative GI/hepatic/renal ROS,         Endo/Other   neg endo/other ROS,       Neuro/Psych/MS   negative neuro/psych ROS,      Cancer    negative hematology/oncology ROS,               Physical Assessment      Airway       Mallampati: II      Mouth Opening: good.            Dental       Dentition intact             Pulmonary    Breath sounds clear to auscultation       Cardiovascular    Rhythm: regular  Rate: Normal       Other findings          Plan  ASA 2     Planned anesthesia type: general     general intravenous                    Intravenous induction       Anesthetic plan and risks discussed with patient  signed consent obtained          Patient's NPO status is appropriate for Anesthesia.

## 2023-02-07 NOTE — OR Surgeon (Signed)
[facility]      Patient Name: Harry, [name] Number: [id number]  Date of Service: 02/07/2023   Date of Birth: 05-11-1946      Pre-Operative Diagnosis: change  in bowel habits     Post-Operative Diagnosis: NORMAL EXAM    Procedure(s)/Description:  COLONOSCOPY: 13086 (CPT)     Attending Surgeon: Ephriam Knuckles, MD     Anesthesia:  CRNA: Magdalene Patricia, CRNA    Anesthesia Type: .General     Specimens Removed: NONE    Patient was taken to the endoscopy suite and given appropriate intravenous sedation.  Videocolonoscope was inserted into the rectum and advanced sequentially to the level of the cecum.  Cecum was confirmed by external palpation, presence of the ileocecal valve and transillumination of the light.  Picture was taken that documents this level.  Colonoscope was then subsequently withdrawn back inspecting all mucosal surfaces.  The ascending colon, transverse colon, descending colon, and sigmoid colon were all visualized with no specific abnormality.  The scope was withdrawn back to the level of the rectum and retroflexed.  The rectal anal junction visualized with no specific abnormality.  Scope was then subsequently straightened and withdrawn.  This concluded the procedure which patient tolerated well.  Based on current recommendations, patient would not need another routine diagnostic colonoscopy for 10 years barring change in presentation and/or symptoms.    FW Dewaine Conger MD FACS

## 2023-02-07 NOTE — Discharge Instructions (Signed)
SURGICAL DISCHARGE INSTRUCTIONS     Dr. Ephriam Knuckles, MD  performed your COLONOSCOPY today at the [facility]    [facility]:  Monday through Friday from 8 a.m. - 4 p.m.  For T&D: [phone]  Between 4 p.m. - 8 a.m., weekends and holidays:  Call ER [phone]    PLEASE SEE WRITTEN HANDOUTS AS DISCUSSED BY YOUR NURSE:  Lissandro Dilorenzo      ANESTHESIA INFORMATION   ANESTHESIA -- ADULT PATIENTS:  You have received intravenous sedation / general anesthesia, and you may feel drowsy and light-headed for several hours. You may even experience some forgetfulness of the procedure. DO NOT DRIVE A MOTOR VEHICLE or perform any activity requiring complete alertness or coordination until you feel fully awake in about 24-48 hours. Do not drink alcoholic beverages for at least 24 hours. Do not stay alone, you must have a responsible adult available to be with you. You may also experience a dry mouth or nausea for 24 hours. This is a normal side effect and will disappear as the effects of the medication wear off.    REMEMBER   If you experience any difficulty breathing, chest pain, bleeding that you feel is excessive, persistent nausea or vomiting or for any other concerns:  Call your physician Dr.  Ephriam Knuckles, MD   at [phone] . You may also ask to have the general doctor on call paged. They are available to you 24 hours a day.      SPECIAL INSTRUCTIONS / COMMENTS   YOUR TEST TODAY SHOWED A NORMAL COLON    FOLLOW-UP APPOINTMENTS   FOLLOW UP WITH DR Dewaine Conger AS NEEDED.

## 2023-02-07 NOTE — Anesthesia Postprocedure Evaluation (Signed)
Anesthesia Post Op Evaluation    Patient: Harry [name]  Procedure(s):  COLONOSCOPY    Last Vitals:Temperature: 36.6 C (97.8 F) (02/07/23 1255)  Heart Rate: 65 (02/07/23 1255)  BP (Non-Invasive): (!) 143/83 (02/07/23 1255)  Respiratory Rate: 16 (02/07/23 1255)  SpO2: 99 % (02/07/23 1255)    No notable events documented.    Patient is sufficiently recovered from the effects of anesthesia to participate in the evaluation and has returned to their pre-procedure level.  Patient location during evaluation: PACU       Patient participation: complete - patient participated  Level of consciousness: awake and alert and responsive to verbal stimuli    Pain management: adequate  Airway patency: patent    Anesthetic complications: no  Cardiovascular status: acceptable  Respiratory status: acceptable  Hydration status: acceptable  Patient post-procedure temperature: Pt Normothermic   PONV Status: Absent

## 2023-02-21 ENCOUNTER — Ambulatory Visit (INDEPENDENT_AMBULATORY_CARE_PROVIDER_SITE_OTHER): Payer: Medicare Other | Admitting: OTOLARYNGOLOGY

## 2023-02-21 ENCOUNTER — Other Ambulatory Visit: Payer: Self-pay

## 2023-02-21 ENCOUNTER — Encounter (INDEPENDENT_AMBULATORY_CARE_PROVIDER_SITE_OTHER): Payer: Self-pay | Admitting: OTOLARYNGOLOGY

## 2023-02-21 VITALS — Ht 65.0 in | Wt 150.0 lb

## 2023-02-21 DIAGNOSIS — J342 Deviated nasal septum: Secondary | ICD-10-CM

## 2023-02-21 DIAGNOSIS — R040 Epistaxis: Secondary | ICD-10-CM

## 2023-02-21 MED ORDER — MUPIROCIN 2 % TOPICAL OINTMENT
TOPICAL_OINTMENT | Freq: Three times a day (TID) | CUTANEOUS | 1 refills | Status: AC
Start: 2023-02-21 — End: ?

## 2023-02-21 NOTE — H&P (Signed)
[facility]  [address]  [place] [address]    Return Patient Visit    Name: Harry Walker MRN:  [id number]   Date: 02/21/2023 DOB: 07-28-1946 (77 y.o.)       Referring Provider:  No ref. provider found    Reason for Visit:   Chief Complaint   Patient presents with    Sinus Problem     1 month rc on epistaxis. States having some pain in left side of sinuses at times.         History of Present Illness:  Harry Walker is a 77 y.o. male who is FU on epistaxis. No recurrence of epistaxis, only seeing dark blood here and there. Using mupirocin BID.        Patient History:  Patient Active Problem List   Diagnosis    Encounter for removal of nasal packing     Current Outpatient Medications   Medication Sig    aspirin (ECOTRIN) 81 mg Oral Tablet, Delayed Release (E.C.) Take 1 Tablet (81 mg total) by mouth Once a day    hydrALAZINE (APRESOLINE) 25 mg Oral Tablet Take 1 Tablet (25 mg total) by mouth Twice daily for 30 days    mupirocin (BACTROBAN) 2 % Ointment Apply topically Twice daily    mupirocin (BACTROBAN) 2 % Ointment Apply topically Three times a day    tamsulosin (FLOMAX) 0.4 mg Oral Capsule Take 1 Capsule (0.4 mg total) by mouth Every evening after dinner    verapamiL 240 mg Oral Tablet Sustained Release Take 1 Tablet (240 mg total) by mouth Once a day      No Known Allergies  Past Medical History:   Diagnosis Date    Bleeding nose     Enlarged prostate     HTN (hypertension)      Past Surgical History:   Procedure Laterality Date    HX HERNIA REPAIR       Family Medical History:    None         Social History     Tobacco Use    Smoking status: Never    Smokeless tobacco: Never   Substance Use Topics    Alcohol use: Never    Drug use: Never       Review of Systems:  Review of Systems    Physical Exam:  Ht 1.651 m (5\' 5" )   Wt 68 kg (150 lb)   BMI 24.96 kg/m       ENT Physical Exam  Constitutional  Appearance: patient appears well-developed, well-nourished and well-groomed, patient is  cooperative;  Communication/Voice: communication appropriate for developmental age; vocal quality normal;  Head and Face  Appearance: head appears normal, face appears normal and face appears atraumatic;  Salivary: glands normal;  Ear  Auricles: right auricle normal; left auricle normal;  Ear Canals: right ear canal normal; left ear canal normal;  Tympanic Membranes: right tympanic membrane normal; left tympanic membrane normal;  Nose  External Nose: nares patent bilaterally; external nose normal;  Internal Nose: bilateral intranasal mucosa edematous; nasal septal deviation present; bilateral inferior turbinates with hypertrophy;  Nose comments: Bilat scab and crusting along septum, no active bleeding.   Oral Cavity/Oropharynx  Lips: normal;  Tongue: normal;  Oral mucosa: normal;  Hard palate: normal;  Soft palate: normal;  Neck  Neck: neck normal; neck palpation normal;  Thyroid: thyroid normal;  Respiratory  Inspection: breathing unlabored; normal breathing rate;  Cardiovascular  Inspection: extremities are warm and  well perfused;  Neurovestibular  Mental Status: alert and oriented;  Psychiatric: mood normal;  Cranial Nerves: cranial nerves intact;       Assessment:  ENCOUNTER DIAGNOSES     ICD-10-CM   1. Epistaxis  R04.0   2. 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  Refilled mupirocin.Marland Kitchen Healing well. No recurrence of epistaxis. Will start saline irrigations..  Orders Placed This Encounter    mupirocin (BACTROBAN) 2 % Ointment     Return in about 3 months (around 05/24/2023).    Marcelline Deist, PA-C  The advanced practice clinician's documentation was reviewed/amended in its entirety with the assessment and plan portion completely performed independently by me during this separate encounter.

## 2023-05-24 ENCOUNTER — Encounter (INDEPENDENT_AMBULATORY_CARE_PROVIDER_SITE_OTHER): Payer: Self-pay | Admitting: OTOLARYNGOLOGY

## 2023-07-14 ENCOUNTER — Ambulatory Visit (INDEPENDENT_AMBULATORY_CARE_PROVIDER_SITE_OTHER): Payer: Medicare Other | Admitting: OTOLARYNGOLOGY
